# Patient Record
Sex: Female | Born: 1935 | Race: White | Hispanic: No
Health system: Southern US, Community
[De-identification: ages and names within clinical notes are randomized; demographics above are authoritative.]

## PROBLEM LIST (undated history)

## (undated) HISTORY — PX: BREAST CYST ASPIRATION: SHX578

---

## 2004-10-23 ENCOUNTER — Ambulatory Visit: Payer: Self-pay | Admitting: Unknown Physician Specialty

## 2005-04-10 ENCOUNTER — Ambulatory Visit: Payer: Self-pay | Admitting: Internal Medicine

## 2005-04-26 ENCOUNTER — Ambulatory Visit: Payer: Self-pay | Admitting: Surgery

## 2005-06-22 ENCOUNTER — Ambulatory Visit: Payer: Self-pay | Admitting: Cardiology

## 2006-05-28 ENCOUNTER — Ambulatory Visit: Payer: Self-pay | Admitting: Internal Medicine

## 2007-06-12 ENCOUNTER — Ambulatory Visit: Payer: Self-pay | Admitting: Internal Medicine

## 2008-01-06 ENCOUNTER — Encounter: Admission: RE | Admit: 2008-01-06 | Discharge: 2008-01-06 | Payer: Self-pay | Admitting: Orthopedic Surgery

## 2008-01-08 ENCOUNTER — Encounter (INDEPENDENT_AMBULATORY_CARE_PROVIDER_SITE_OTHER): Payer: Self-pay | Admitting: Orthopedic Surgery

## 2008-01-08 ENCOUNTER — Ambulatory Visit (HOSPITAL_BASED_OUTPATIENT_CLINIC_OR_DEPARTMENT_OTHER): Admission: RE | Admit: 2008-01-08 | Discharge: 2008-01-08 | Payer: Self-pay | Admitting: Orthopedic Surgery

## 2008-04-26 ENCOUNTER — Ambulatory Visit: Payer: Self-pay | Admitting: Unknown Physician Specialty

## 2008-05-25 ENCOUNTER — Ambulatory Visit: Payer: Self-pay | Admitting: Internal Medicine

## 2008-06-23 ENCOUNTER — Ambulatory Visit: Payer: Self-pay | Admitting: Internal Medicine

## 2009-05-31 ENCOUNTER — Ambulatory Visit: Payer: Self-pay | Admitting: Internal Medicine

## 2009-06-28 ENCOUNTER — Ambulatory Visit: Payer: Self-pay | Admitting: Internal Medicine

## 2009-08-09 ENCOUNTER — Ambulatory Visit: Payer: Self-pay | Admitting: Internal Medicine

## 2010-07-03 ENCOUNTER — Ambulatory Visit: Payer: Self-pay

## 2010-12-26 NOTE — Op Note (Signed)
Courtney Raymond, Courtney Raymond                ACCOUNT NO.:  [id number]   MEDICAL RECORD NO.:  [id number]          PATIENT TYPE:  AMB   LOCATION:  DSC                          FACILITY:  [facility]   PHYSICIAN:  Katy Fitch. Sypher, M.D. DATE OF BIRTH:  06-23-1930   DATE OF PROCEDURE:  01/08/2008  DATE OF DISCHARGE:                               OPERATIVE REPORT   PREOPERATIVE DIAGNOSIS:  Enlarging mass, volar-radial aspect, right long  finger, middle phalanx.   POSTOPERATIVE DIAGNOSIS:  Probable hemangioma.   OPERATION:  Excisional biopsy of vascular mass, right long finger, volar  aspect.   OPERATING SURGEON:  Katy Fitch. Sypher, MD.   ASSISTANT:  Annye Rusk, PA-C.   ANESTHESIA:  Lidocaine 2% metacarpal head-level block supplemented by IV  sedation.   SUPERVISING ANESTHESIOLOGIST:  Zenon Mayo, MD.   INDICATIONS:  Courtney Raymond is a 77-year woman referred from [place],  [place], for evaluation and management of right long finger mass.   Clinical examination reveals signs of a firm painless mass on the volar-  radial aspect of the right long finger, middle phalangeal segment.  This  is approximately 1 cm in diameter.  This was adjacent to the radial  neurovascular bundle.   Due to failure to respond to nonoperative measures, she is brought to  the operating room at this time for excisional biopsy for diagnosis.   PROCEDURE IN DETAIL:  Talasia Saulter was brought to the operating room and  placed in supine position on the operating table.   Following light sedation, the right arm was prepped with Betadine soap  solution and sterilely draped.  The right long finger was exsanguinated  with a gauze wrap and a 1/2-inch Penrose drain was placed over the  proximal phalangeal segment as a digital tourniquet.   The mass was exposed through a Brunner zigzag incision over the proximal  and middle portion of the middle phalangeal segment of the finger.  Subcutaneous tissues were  carefully divided taking care to identify the  radial neurovascular bundle site.  The mass was purplish, firm, and  adherent to multiple veins.  This was circumferentially dissected, and  the 3 veins were resected with a margin.  A second small hemangioma-type  lesion was identified on the ulnar aspect of the middle phalangeal  segment.  Both lesions were resected en bloc and passed off in formalin  for pathologic evaluation.  Bleeding points were electrocauterized with  bipolar current followed by repair of the skin with mattress suture of 5-  0 nylon x3.   The finger was then dressed with Xeroform sterile gauze and a Coban  dressing.  There were no apparent complications.      Katy Fitch Sypher, M.D.  Electronically Signed     RVS/MEDQ  D:  01/08/2008  T:  01/09/2008  Job:  [id number]

## 2010-12-28 ENCOUNTER — Ambulatory Visit: Payer: Self-pay | Admitting: Internal Medicine

## 2011-05-09 LAB — BASIC METABOLIC PANEL
BUN: 15
CO2: 31
Chloride: 101
Creatinine, Ser: 0.68
Glucose, Bld: 94
Potassium: 4.4

## 2011-07-16 ENCOUNTER — Ambulatory Visit: Payer: Self-pay | Admitting: Internal Medicine

## 2012-07-17 ENCOUNTER — Ambulatory Visit: Payer: Self-pay | Admitting: Unknown Physician Specialty

## 2013-07-22 ENCOUNTER — Ambulatory Visit: Payer: Self-pay | Admitting: Internal Medicine

## 2014-03-11 ENCOUNTER — Ambulatory Visit: Payer: Self-pay | Admitting: Internal Medicine

## 2014-07-27 ENCOUNTER — Ambulatory Visit: Payer: Self-pay | Admitting: Family Medicine

## 2014-08-18 ENCOUNTER — Ambulatory Visit: Payer: Self-pay | Admitting: Family Medicine

## 2015-05-30 ENCOUNTER — Other Ambulatory Visit: Payer: Self-pay | Admitting: Family Medicine

## 2015-05-30 DIAGNOSIS — R911 Solitary pulmonary nodule: Secondary | ICD-10-CM

## 2015-06-02 ENCOUNTER — Ambulatory Visit
Admission: RE | Admit: 2015-06-02 | Discharge: 2015-06-02 | Disposition: A | Discharge status: Home / Self Care | Payer: Medicare Other | Source: Ambulatory Visit | Attending: Family Medicine | Admitting: Family Medicine

## 2015-06-02 DIAGNOSIS — D71 Functional disorders of polymorphonuclear neutrophils: Secondary | ICD-10-CM | POA: Insufficient documentation

## 2015-06-02 DIAGNOSIS — J479 Bronchiectasis, uncomplicated: Secondary | ICD-10-CM | POA: Insufficient documentation

## 2015-06-02 DIAGNOSIS — J8410 Pulmonary fibrosis, unspecified: Secondary | ICD-10-CM | POA: Diagnosis not present

## 2015-06-02 DIAGNOSIS — I517 Cardiomegaly: Secondary | ICD-10-CM | POA: Insufficient documentation

## 2015-06-02 DIAGNOSIS — R911 Solitary pulmonary nodule: Secondary | ICD-10-CM | POA: Diagnosis present

## 2015-06-02 MED ORDER — IOHEXOL 300 MG/ML  SOLN
75.0000 mL | Freq: Once | INTRAMUSCULAR | Status: AC | PRN
  Administered 2015-06-02: 65 mL via INTRAVENOUS

## 2015-07-18 ENCOUNTER — Other Ambulatory Visit: Payer: Self-pay | Admitting: Family Medicine

## 2015-07-18 DIAGNOSIS — Z1231 Encounter for screening mammogram for malignant neoplasm of breast: Secondary | ICD-10-CM

## 2015-07-21 ENCOUNTER — Ambulatory Visit: Payer: Medicare Other

## 2015-08-02 ENCOUNTER — Ambulatory Visit: Payer: Medicare Other

## 2015-08-23 ENCOUNTER — Ambulatory Visit: Payer: Medicare Other

## 2015-08-31 ENCOUNTER — Ambulatory Visit: Payer: Medicare Other

## 2015-09-09 ENCOUNTER — Other Ambulatory Visit: Payer: Self-pay | Admitting: Family Medicine

## 2015-09-09 ENCOUNTER — Ambulatory Visit
Admission: RE | Admit: 2015-09-09 | Discharge: 2015-09-09 | Disposition: A | Discharge status: Home / Self Care | Payer: Medicare Other | Source: Ambulatory Visit | Attending: Family Medicine | Admitting: Family Medicine

## 2015-09-09 DIAGNOSIS — Z1231 Encounter for screening mammogram for malignant neoplasm of breast: Secondary | ICD-10-CM

## 2016-01-17 ENCOUNTER — Other Ambulatory Visit: Payer: Self-pay | Admitting: Orthopedic Surgery

## 2016-01-17 DIAGNOSIS — M5030 Other cervical disc degeneration, unspecified cervical region: Secondary | ICD-10-CM

## 2016-01-18 ENCOUNTER — Other Ambulatory Visit: Payer: Self-pay | Admitting: Physician Assistant

## 2016-01-18 DIAGNOSIS — M5030 Other cervical disc degeneration, unspecified cervical region: Secondary | ICD-10-CM

## 2016-02-08 ENCOUNTER — Ambulatory Visit
Admission: RE | Admit: 2016-02-08 | Discharge: 2016-02-08 | Disposition: A | Discharge status: Home / Self Care | Payer: Medicare Other | Source: Ambulatory Visit | Attending: Orthopedic Surgery | Admitting: Orthopedic Surgery

## 2016-02-08 DIAGNOSIS — M5030 Other cervical disc degeneration, unspecified cervical region: Secondary | ICD-10-CM | POA: Diagnosis present

## 2016-02-08 DIAGNOSIS — M47812 Spondylosis without myelopathy or radiculopathy, cervical region: Secondary | ICD-10-CM | POA: Insufficient documentation

## 2016-02-08 DIAGNOSIS — M4802 Spinal stenosis, cervical region: Secondary | ICD-10-CM | POA: Insufficient documentation

## 2016-02-08 DIAGNOSIS — M1288 Other specific arthropathies, not elsewhere classified, other specified site: Secondary | ICD-10-CM | POA: Diagnosis not present

## 2016-10-04 ENCOUNTER — Other Ambulatory Visit: Payer: Self-pay | Admitting: Family Medicine

## 2016-10-04 DIAGNOSIS — Z1231 Encounter for screening mammogram for malignant neoplasm of breast: Secondary | ICD-10-CM

## 2016-11-02 ENCOUNTER — Ambulatory Visit
Admission: RE | Admit: 2016-11-02 | Discharge: 2016-11-02 | Disposition: A | Discharge status: Home / Self Care | Payer: Medicare Other | Source: Ambulatory Visit | Attending: Family Medicine | Admitting: Family Medicine

## 2016-11-02 DIAGNOSIS — Z1231 Encounter for screening mammogram for malignant neoplasm of breast: Secondary | ICD-10-CM | POA: Diagnosis present

## 2017-09-25 ENCOUNTER — Other Ambulatory Visit: Payer: Self-pay | Admitting: Family Medicine

## 2017-09-25 DIAGNOSIS — Z1231 Encounter for screening mammogram for malignant neoplasm of breast: Secondary | ICD-10-CM

## 2017-11-04 ENCOUNTER — Ambulatory Visit
Admission: RE | Admit: 2017-11-04 | Discharge: 2017-11-04 | Disposition: A | Discharge status: Home / Self Care | Payer: Medicare Other | Source: Ambulatory Visit | Attending: Family Medicine | Admitting: Family Medicine

## 2017-11-04 DIAGNOSIS — Z1231 Encounter for screening mammogram for malignant neoplasm of breast: Secondary | ICD-10-CM | POA: Diagnosis present

## 2018-09-25 ENCOUNTER — Other Ambulatory Visit: Payer: Self-pay | Admitting: Family Medicine

## 2018-09-25 DIAGNOSIS — Z1231 Encounter for screening mammogram for malignant neoplasm of breast: Secondary | ICD-10-CM

## 2020-03-03 ENCOUNTER — Other Ambulatory Visit: Payer: Self-pay

## 2020-03-03 ENCOUNTER — Ambulatory Visit (INDEPENDENT_AMBULATORY_CARE_PROVIDER_SITE_OTHER): Payer: Medicare Other | Admitting: Dermatology

## 2020-03-03 DIAGNOSIS — Z1283 Encounter for screening for malignant neoplasm of skin: Secondary | ICD-10-CM

## 2020-03-03 DIAGNOSIS — S81811A Laceration without foreign body, right lower leg, initial encounter: Secondary | ICD-10-CM

## 2020-03-03 DIAGNOSIS — D1800 Hemangioma unspecified site: Secondary | ICD-10-CM

## 2020-03-03 DIAGNOSIS — D692 Other nonthrombocytopenic purpura: Secondary | ICD-10-CM

## 2020-03-03 DIAGNOSIS — I872 Venous insufficiency (chronic) (peripheral): Secondary | ICD-10-CM

## 2020-03-03 DIAGNOSIS — L578 Other skin changes due to chronic exposure to nonionizing radiation: Secondary | ICD-10-CM

## 2020-03-03 DIAGNOSIS — L821 Other seborrheic keratosis: Secondary | ICD-10-CM

## 2020-03-03 DIAGNOSIS — L84 Corns and callosities: Secondary | ICD-10-CM

## 2020-03-03 DIAGNOSIS — L814 Other melanin hyperpigmentation: Secondary | ICD-10-CM

## 2020-03-03 DIAGNOSIS — D229 Melanocytic nevi, unspecified: Secondary | ICD-10-CM

## 2020-03-03 NOTE — Progress Notes (Signed)
   Follow-Up Visit   Subjective  Courtney Raymond is a 86 y.o. female who presents for the following: Annual Exam (No hx of skin CA or irregular nevi - the patient has noticed no new or changing moles, lesions, or spots). The patient presents for Total-Body Skin Exam (TBSE) for skin cancer screening and mole check.  The following portions of the chart were reviewed this encounter and updated as appropriate:  Allergies  Meds  Problems  Med Hx  Surg Hx  Fam Hx     Review of Systems:  No other skin or systemic complaints except as noted in HPI or Assessment and Plan.  Objective  Well appearing patient in no apparent distress; mood and affect are within normal limits.  A full examination was performed including scalp, head, eyes, ears, nose, lips, neck, chest, axillae, abdomen, back, buttocks, bilateral upper extremities, bilateral lower extremities, hands, feet, fingers, toes, fingernails, and toenails. All findings within normal limits unless otherwise noted below.  Objective  B/L lower leg: Hyperpigmentation of the lower legs  Objective  R med 3rd toe: Thickened plaque   Objective  Right Lower Leg - Anterior: Laceration  Assessment & Plan    Stasis dermatitis of both legs B/L lower leg  Callus of foot R med 3rd toe  Recommend silicone spacers to prevent rubbing. Consider seeing podiatrist if bothersome. Recommend Voltaren cream OTC PRN pain.  Laceration of right lower leg, initial encounter Right Lower Leg - Anterior  Occurred in April 2021 -  Discussed with patient that due to age and location laceration will take longer to heal  Skin cancer screening No evidence of infection today.  Lentigines - Scattered tan macules - Discussed due to sun exposure - Benign, observe - Call for any changes  Seborrheic Keratoses - Stuck-on, waxy, tan-brown papules and plaques  - Discussed benign etiology and prognosis. - Observe - Call for any changes  Melanocytic  Nevi - Tan-brown and/or pink-flesh-colored symmetric macules and papules - Benign appearing on exam today - Observation - Call clinic for new or changing moles - Recommend daily use of broad spectrum spf 30+ sunscreen to sun-exposed areas.   Hemangiomas - Red papules - Discussed benign nature - Observe - Call for any changes  Actinic Damage - diffuse scaly erythematous macules with underlying dyspigmentation - Recommend daily broad spectrum sunscreen SPF 30+ to sun-exposed areas, reapply every 2 hours as needed.  - Call for new or changing lesions.  Purpura - Violaceous macules and patches - Benign - Related to age, sun damage and/or use of blood thinners - Observe - Can use OTC arnica containing moisturizer such as Dermend Bruise Formula if desired - Call for worsening or other concerns  Skin cancer screening performed today.  Return in about 1 year (around 03/03/2021) for TBSE.  Luther Redo, CMA, am acting as scribe for Sarina Ser, MD .  Documentation: I have reviewed the above documentation for accuracy and completeness, and I agree with the above.  Sarina Ser, MD

## 2020-03-06 ENCOUNTER — Encounter: Payer: Self-pay | Admitting: Dermatology

## 2020-05-19 ENCOUNTER — Other Ambulatory Visit: Payer: Self-pay | Admitting: Family Medicine

## 2020-05-19 DIAGNOSIS — Z1231 Encounter for screening mammogram for malignant neoplasm of breast: Secondary | ICD-10-CM

## 2020-08-22 ENCOUNTER — Ambulatory Visit
Admission: RE | Admit: 2020-08-22 | Discharge: 2020-08-22 | Disposition: A | Discharge status: Home / Self Care | Payer: Medicare Other | Source: Ambulatory Visit | Attending: Family Medicine | Admitting: Family Medicine

## 2020-08-22 ENCOUNTER — Other Ambulatory Visit: Payer: Self-pay

## 2020-08-22 DIAGNOSIS — Z1231 Encounter for screening mammogram for malignant neoplasm of breast: Secondary | ICD-10-CM | POA: Insufficient documentation

## 2020-08-30 ENCOUNTER — Other Ambulatory Visit: Payer: Self-pay | Admitting: Family Medicine

## 2020-08-30 DIAGNOSIS — R928 Other abnormal and inconclusive findings on diagnostic imaging of breast: Secondary | ICD-10-CM

## 2020-08-30 DIAGNOSIS — N6320 Unspecified lump in the left breast, unspecified quadrant: Secondary | ICD-10-CM

## 2020-09-06 ENCOUNTER — Ambulatory Visit
Admission: RE | Admit: 2020-09-06 | Discharge: 2020-09-06 | Disposition: A | Discharge status: Home / Self Care | Payer: Medicare Other | Source: Ambulatory Visit | Attending: Family Medicine | Admitting: Family Medicine

## 2020-09-06 ENCOUNTER — Other Ambulatory Visit: Payer: Self-pay

## 2020-09-06 DIAGNOSIS — R928 Other abnormal and inconclusive findings on diagnostic imaging of breast: Secondary | ICD-10-CM | POA: Insufficient documentation

## 2020-09-06 DIAGNOSIS — N6320 Unspecified lump in the left breast, unspecified quadrant: Secondary | ICD-10-CM

## 2021-03-15 ENCOUNTER — Ambulatory Visit (INDEPENDENT_AMBULATORY_CARE_PROVIDER_SITE_OTHER): Payer: Medicare Other | Admitting: Dermatology

## 2021-03-15 ENCOUNTER — Encounter: Payer: Self-pay | Admitting: Dermatology

## 2021-03-15 ENCOUNTER — Other Ambulatory Visit: Payer: Self-pay

## 2021-03-15 DIAGNOSIS — D1800 Hemangioma unspecified site: Secondary | ICD-10-CM

## 2021-03-15 DIAGNOSIS — L817 Pigmented purpuric dermatosis: Secondary | ICD-10-CM

## 2021-03-15 DIAGNOSIS — R202 Paresthesia of skin: Secondary | ICD-10-CM

## 2021-03-15 DIAGNOSIS — D229 Melanocytic nevi, unspecified: Secondary | ICD-10-CM

## 2021-03-15 DIAGNOSIS — Z1283 Encounter for screening for malignant neoplasm of skin: Secondary | ICD-10-CM | POA: Diagnosis not present

## 2021-03-15 DIAGNOSIS — L578 Other skin changes due to chronic exposure to nonionizing radiation: Secondary | ICD-10-CM

## 2021-03-15 DIAGNOSIS — L814 Other melanin hyperpigmentation: Secondary | ICD-10-CM

## 2021-03-15 DIAGNOSIS — L821 Other seborrheic keratosis: Secondary | ICD-10-CM

## 2021-03-15 NOTE — Patient Instructions (Addendum)
Recommend Sarna lotion for itch of right upper back.   If you have any questions or concerns for your doctor, please call our main line at [phone] and press option 4 to reach your doctor's medical assistant. If no one answers, please leave a voicemail as directed and we will return your call as soon as possible. Messages left after 4 pm will be answered the following business day.   You may also send [place] a message via [place]. We typically respond to MyChart messages within 1-2 business days.  For prescription refills, please ask your pharmacy to contact our office. Our fax number is [phone].  If you have an urgent issue when the clinic is closed that cannot wait until the next business day, you can page your doctor at the number below.    Please note that while we do our best to be available for urgent issues outside of office hours, we are not available 24/7.   If you have an urgent issue and are unable to reach [place], you may choose to seek medical care at your doctor's office, retail clinic, urgent care center, or emergency room.  If you have a medical emergency, please immediately call 911 or go to the emergency department.  Pager Numbers  - Dr. Nehemiah Massed: [phone]  - Dr. Laurence Ferrari: [phone]  - Dr. Nicole Kindred: [phone]  In the event of inclement weather, please call our main line at [phone] for an update on the status of any delays or closures.  Dermatology Medication Tips: Please keep the boxes that topical medications come in in order to help keep track of the instructions about where and how to use these. Pharmacies typically print the medication instructions only on the boxes and not directly on the medication tubes.   If your medication is too expensive, please contact our office at [phone] option 4 or send [place] a message through [place].   We are unable to tell what your co-pay for medications will be in advance as this is different depending on your  insurance coverage. However, we may be able to find a substitute medication at lower cost or fill out paperwork to get insurance to cover a needed medication.   If a prior authorization is required to get your medication covered by your insurance company, please allow [place] 1-2 business days to complete this process.  Drug prices often vary depending on where the prescription is filled and some pharmacies may offer cheaper prices.  The website www.goodrx.com contains coupons for medications through different pharmacies. The prices here do not account for what the cost may be with help from insurance (it may be cheaper with your insurance), but the website can give you the price if you did not use any insurance.  - You can print the associated coupon and take it with your prescription to the pharmacy.  - You may also stop by our office during regular business hours and pick up a GoodRx coupon card.  - If you need your prescription sent electronically to a different pharmacy, notify our office through [facility] or by phone at [phone] option 4.

## 2021-03-15 NOTE — Progress Notes (Signed)
   Follow-Up Visit   Subjective  Courtney Raymond is a 87 y.o. female who presents for the following: Annual Exam (No history of skin cancer - TBSE today). The patient presents for Total-Body Skin Exam (TBSE) for skin cancer screening and mole check.  The following portions of the chart were reviewed this encounter and updated as appropriate:   Allergies  Meds  Problems  Med Hx  Surg Hx  Fam Hx     Review of Systems:  No other skin or systemic complaints except as noted in HPI or Assessment and Plan.  Objective  Well appearing patient in no apparent distress; mood and affect are within normal limits.  A full examination was performed including scalp, head, eyes, ears, nose, lips, neck, chest, axillae, abdomen, back, buttocks, bilateral upper extremities, bilateral lower extremities, hands, feet, fingers, toes, fingernails, and toenails. All findings within normal limits unless otherwise noted below.  Right scapula Dyschromia and hypopigmentation  Lower legs Hyperpigmentation    Assessment & Plan   Lentigines - Scattered tan macules - Due to sun exposure - Benign-appering, observe - Recommend daily broad spectrum sunscreen SPF 30+ to sun-exposed areas, reapply every 2 hours as needed. - Call for any changes  Seborrheic Keratoses - Stuck-on, waxy, tan-brown papules and/or plaques  - Benign-appearing - Discussed benign etiology and prognosis. - Observe - Call for any changes  Melanocytic Nevi - Tan-brown and/or pink-flesh-colored symmetric macules and papules - Benign appearing on exam today - Observation - Call clinic for new or changing moles - Recommend daily use of broad spectrum spf 30+ sunscreen to sun-exposed areas.   Hemangiomas - Red papules - Discussed benign nature - Observe - Call for any changes  Actinic Damage - Chronic condition, secondary to cumulative UV/sun exposure - diffuse scaly erythematous macules with underlying dyspigmentation -  Recommend daily broad spectrum sunscreen SPF 30+ to sun-exposed areas, reapply every 2 hours as needed.  - Staying in the shade or wearing long sleeves, sun glasses (UVA+UVB protection) and wide brim hats (4-inch brim around the entire circumference of the hat) are also recommended for sun protection.  - Call for new or changing lesions.  Skin cancer screening performed today.  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 This is usually accompanied by chronic rubbing or scratching. There is no cure, but there are some treatments which may help control the itch.   Over the counter (non-prescription) treatments for notalgia paresthetica include numbing creams like pramoxine or lidocaine which temporarily reduce itch or Capsaicin-containing creams which cause a burning sensation but which sometimes over time will reset the nerves to stop producing itch.   If you choose to use Capsaicin cream, it is recommended to use it 5 times daily for 1 week followed by 3 times daily for 3-6 weeks. You may have to continue using it long-term. For severe cases, there are some prescription cream or pill options which may help.  Schamberg's purpura Lower legs Benign  Skin cancer screening  Return in about 1 year (around 03/15/2022) for TBSE.  I, Ashok Cordia, CMA, am acting as scribe for Sarina Ser, MD .  Documentation: I have reviewed the above documentation for accuracy and completeness, and I agree with the above.  Sarina Ser, MD

## 2021-07-31 ENCOUNTER — Other Ambulatory Visit: Payer: Self-pay | Admitting: Family Medicine

## 2021-07-31 DIAGNOSIS — Z1231 Encounter for screening mammogram for malignant neoplasm of breast: Secondary | ICD-10-CM

## 2021-08-25 ENCOUNTER — Ambulatory Visit
Admission: RE | Admit: 2021-08-25 | Discharge: 2021-08-25 | Disposition: A | Discharge status: Home / Self Care | Payer: Medicare Other | Source: Ambulatory Visit | Attending: Family Medicine | Admitting: Family Medicine

## 2021-08-25 ENCOUNTER — Other Ambulatory Visit: Payer: Self-pay

## 2021-08-25 DIAGNOSIS — Z1231 Encounter for screening mammogram for malignant neoplasm of breast: Secondary | ICD-10-CM | POA: Diagnosis present

## 2022-03-15 ENCOUNTER — Ambulatory Visit (INDEPENDENT_AMBULATORY_CARE_PROVIDER_SITE_OTHER): Payer: Medicare Other | Admitting: Dermatology

## 2022-03-15 DIAGNOSIS — D692 Other nonthrombocytopenic purpura: Secondary | ICD-10-CM

## 2022-03-15 DIAGNOSIS — R202 Paresthesia of skin: Secondary | ICD-10-CM | POA: Diagnosis not present

## 2022-03-15 DIAGNOSIS — L814 Other melanin hyperpigmentation: Secondary | ICD-10-CM

## 2022-03-15 DIAGNOSIS — D1800 Hemangioma unspecified site: Secondary | ICD-10-CM

## 2022-03-15 DIAGNOSIS — D229 Melanocytic nevi, unspecified: Secondary | ICD-10-CM

## 2022-03-15 DIAGNOSIS — I872 Venous insufficiency (chronic) (peripheral): Secondary | ICD-10-CM

## 2022-03-15 DIAGNOSIS — L578 Other skin changes due to chronic exposure to nonionizing radiation: Secondary | ICD-10-CM

## 2022-03-15 DIAGNOSIS — Z1283 Encounter for screening for malignant neoplasm of skin: Secondary | ICD-10-CM | POA: Diagnosis not present

## 2022-03-15 DIAGNOSIS — L821 Other seborrheic keratosis: Secondary | ICD-10-CM

## 2022-03-15 NOTE — Progress Notes (Signed)
Follow-Up Visit   Subjective  Courtney Raymond is a 88 y.o. female who presents for the following The patient presents for Total-Body Skin Exam (TBSE) for skin cancer screening and mole check.  The patient has spots, moles and lesions to be evaluated, some may be new or changing and the patient has concerns that these could be cancer. She has some itching on her back and has a dark area there.  The following portions of the chart were reviewed this encounter and updated as appropriate:   Allergies  Meds  Problems  Med Hx  Surg Hx  Fam Hx     Review of Systems:  No other skin or systemic complaints except as noted in HPI or Assessment and Plan.  Objective  Well appearing patient in no apparent distress; mood and affect are within normal limits.  A full examination was performed including scalp, head, eyes, ears, nose, lips, neck, chest, axillae, abdomen, back, buttocks, bilateral upper extremities, bilateral lower extremities, hands, feet, fingers, toes, fingernails, and toenails. All findings within normal limits unless otherwise noted below.  Back Dyschromia of the R scapula area.  B/L leg Erythematous, scaly patches involving the ankle and distal lower leg with associated lower leg edema.    Assessment & Plan  Notalgia paresthetica Back Notalgia paresthetica is a chronic condition affecting the skin of the back in which a pinched nerve along the spine causes itching or changes in sensation in an area of skin. This is usually accompanied by chronic rubbing or scratching often leaving the area of skin discolored and thickened. There is no cure, but there are some treatments which may help control the itch. Over the counter (non-prescription) treatments for notalgia paresthetica include numbing creams like pramoxine or lidocaine which temporarily reduce itch or Capsaicin-containing creams which cause a burning sensation but which sometimes over time will reset the nerves to stop  producing itch.  If you choose to use Capsaicin cream, it is recommended to use it 5 times daily for 1 week followed by 3 times daily for 3-6 weeks. You may have to continue using it long-term. For severe cases, there are some prescription cream or pill options which may help. Other treatment options include: - Transcutaneous Electrical Nerve Stimulation (TENS) - Gabapentin 300-900 mg daily po - Amitriptyline orally - Paravertebral local anesthetic block - intralesional Botulinum toxin A   Stasis dermatitis of both legs B/L leg With schamberg's purpura - Benign, observe.   Reassured.  Lentigines - Scattered tan macules - Due to sun exposure - Benign-appearing, observe - Recommend daily broad spectrum sunscreen SPF 30+ to sun-exposed areas, reapply every 2 hours as needed. - Call for any changes  Seborrheic Keratoses - Stuck-on, waxy, tan-brown papules and/or plaques  - Benign-appearing - Discussed benign etiology and prognosis. - Observe - Call for any changes  Melanocytic Nevi - Tan-brown and/or pink-flesh-colored symmetric macules and papules - Benign appearing on exam today - Observation - Call clinic for new or changing moles - Recommend daily use of broad spectrum spf 30+ sunscreen to sun-exposed areas.   Hemangiomas - Red papules - Discussed benign nature - Observe - Call for any changes  Actinic Damage - Chronic condition, secondary to cumulative UV/sun exposure - diffuse scaly erythematous macules with underlying dyspigmentation - Recommend daily broad spectrum sunscreen SPF 30+ to sun-exposed areas, reapply every 2 hours as needed.  - Staying in the shade or wearing long sleeves, sun glasses (UVA+UVB protection) and wide brim hats (4-inch brim  around the entire circumference of the hat) are also recommended for sun protection.  - Call for new or changing lesions.  Purpura - Chronic; persistent and recurrent.  Treatable, but not curable. - Violaceous macules  and patches - Benign - Related to trauma, age, sun damage and/or use of blood thinners, chronic use of topical and/or oral steroids - Observe - Can use OTC arnica containing moisturizer such as Dermend Bruise Formula if desired - Call for worsening or other concerns  Skin cancer screening performed today.  Return if symptoms worsen or fail to improve.  Luther Redo, CMA, am acting as scribe for Sarina Ser, MD . Documentation: I have reviewed the above documentation for accuracy and completeness, and I agree with the above.  Sarina Ser, MD

## 2022-03-15 NOTE — Patient Instructions (Addendum)
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 This is usually accompanied by chronic rubbing or scratching often leaving the area of skin discolored and thickened. There is no cure, but there are some treatments which may help control the itch.   Over the counter (non-prescription) treatments for notalgia paresthetica include numbing creams like pramoxine or lidocaine which temporarily reduce itch or Capsaicin-containing creams which cause a burning sensation but which sometimes over time will reset the nerves to stop producing itch.  If you choose to use Capsaicin cream, it is recommended to use it 5 times daily for 1 week followed by 3 times daily for 3-6 weeks. You may have to continue using it long-term. For severe cases, there are some prescription cream or pill options which may help. Other treatment options include: - Transcutaneous Electrical Nerve Stimulation (TENS) - Gabapentin 300-900 mg daily po - Amitriptyline orally - Paravertebral local anesthetic block - intralesional Botulinum toxin A   Due to recent changes in healthcare laws, you may see results of your pathology and/or laboratory studies on MyChart before the doctors have had a chance to review them. We understand that in some cases there may be results that are confusing or concerning to you. Please understand that not all results are received at the same time and often the doctors may need to interpret multiple results in order to provide you with the best plan of care or course of treatment. Therefore, we ask that you please give us 2 business days to thoroughly review all your results before contacting the office for clarification. Should we see a critical lab result, you will be contacted sooner.   If You Need Anything After Your Visit  If you have any questions or concerns for your doctor, please  call our main line at [phone] and press option 4 to reach your doctor's medical assistant. If no one answers, please leave a voicemail as directed and we will return your call as soon as possible. Messages left after 4 pm will be answered the following business day.   You may also send us a message via MyChart. We typically respond to MyChart messages within 1-2 business days.  For prescription refills, please ask your pharmacy to contact our office. Our fax number is [phone].  If you have an urgent issue when the clinic is closed that cannot wait until the next business day, you can page your doctor at the number below.    Please note that while we do our best to be available for urgent issues outside of office hours, we are not available 24/7.   If you have an urgent issue and are unable to reach us, you may choose to seek medical care at your doctor's office, retail clinic, urgent care center, or emergency room.  If you have a medical emergency, please immediately call 911 or go to the emergency department.  Pager Numbers  - Dr. Kowalski: [phone]  - Dr. Moye: [phone]  - Dr. Stewart: [phone]  In the event of inclement weather, please call our main line at [phone] for an update on the status of any delays or closures.  Dermatology Medication Tips: Please keep the boxes that topical medications come in in order to help keep track of the instructions about where and how to use these. Pharmacies typically print the medication instructions only on the boxes   and not directly on the medication tubes.   If your medication is too expensive, please contact our office at [phone] option 4 or send us a message through MyChart.   We are unable to tell what your co-pay for medications will be in advance as this is different depending on your insurance coverage. However, we may be able to find a substitute medication at lower cost or fill out paperwork to get  insurance to cover a needed medication.   If a prior authorization is required to get your medication covered by your insurance company, please allow us 1-2 business days to complete this process.  Drug prices often vary depending on where the prescription is filled and some pharmacies may offer cheaper prices.  The website www.goodrx.com contains coupons for medications through different pharmacies. The prices here do not account for what the cost may be with help from insurance (it may be cheaper with your insurance), but the website can give you the price if you did not use any insurance.  - You can print the associated coupon and take it with your prescription to the pharmacy.  - You may also stop by our office during regular business hours and pick up a GoodRx coupon card.  - If you need your prescription sent electronically to a different pharmacy, notify our office through [facility] MyChart or by phone at [phone] option 4.     Si Usted Necesita Algo Despus de Su Visita  Tambin puede enviarnos un mensaje a travs de MyChart. Por lo general respondemos a los mensajes de MyChart en el transcurso de 1 a 2 das hbiles.  Para renovar recetas, por favor pida a su farmacia que se ponga en contacto con nuestra oficina. Nuestro nmero de fax es el [phone].  Si tiene un asunto urgente cuando la clnica est cerrada y que no puede esperar hasta el siguiente da hbil, puede llamar/localizar a su doctor(a) al nmero que aparece a continuacin.   Por favor, tenga en cuenta que aunque hacemos todo lo posible para estar disponibles para asuntos urgentes fuera del horario de oficina, no estamos disponibles las 24 horas del da, los 7 das de la semana.   Si tiene un problema urgente y no puede comunicarse con nosotros, puede optar por buscar atencin mdica  en el consultorio de su doctor(a), en una clnica privada, en un centro de atencin urgente o en una sala de emergencias.  Si  tiene una emergencia mdica, por favor llame inmediatamente al 911 o vaya a la sala de emergencias.  Nmeros de bper  - Dr. Kowalski: [phone]  - Dra. Moye: [phone]  - Dra. Stewart: [phone]  En caso de inclemencias del tiempo, por favor llame a nuestra lnea principal al [phone] para una actualizacin sobre el estado de cualquier retraso o cierre.  Consejos para la medicacin en dermatologa: Por favor, guarde las cajas en las que vienen los medicamentos de uso tpico para ayudarle a seguir las instrucciones sobre dnde y cmo usarlos. Las farmacias generalmente imprimen las instrucciones del medicamento slo en las cajas y no directamente en los tubos del medicamento.   Si su medicamento es muy caro, por favor, pngase en contacto con nuestra oficina llamando al [phone] y presione la opcin 4 o envenos un mensaje a travs de MyChart.   No podemos decirle cul ser su copago por los medicamentos por adelantado ya que esto es diferente dependiendo de la cobertura de su seguro. Sin embargo, es posible que podamos encontrar   un medicamento sustituto a menor costo o llenar un formulario para que el seguro cubra el medicamento que se considera necesario.   Si se requiere una autorizacin previa para que su compaa de seguros cubra su medicamento, por favor permtanos de 1 a 2 das hbiles para completar este proceso.  Los precios de los medicamentos varan con frecuencia dependiendo del lugar de dnde se surte la receta y alguna farmacias pueden ofrecer precios ms baratos.  El sitio web www.goodrx.com tiene cupones para medicamentos de diferentes farmacias. Los precios aqu no tienen en cuenta lo que podra costar con la ayuda del seguro (puede ser ms barato con su seguro), pero el sitio web puede darle el precio si no utiliz ningn seguro.  - Puede imprimir el cupn correspondiente y llevarlo con su receta a la farmacia.  - Tambin puede pasar por nuestra oficina  durante el horario de atencin regular y recoger una tarjeta de cupones de GoodRx.  - Si necesita que su receta se enve electrnicamente a una farmacia diferente, informe a nuestra oficina a travs de MyChart de [facility] o por telfono llamando al [phone] y presione la opcin 4.  

## 2022-03-17 ENCOUNTER — Encounter: Payer: Self-pay | Admitting: Dermatology

## 2022-10-13 IMAGING — MG MM DIGITAL SCREENING BILAT W/ TOMO AND CAD
6 of 10 series · 6 of 30 positions shown · non-contrast
Comparison: Previous exam(s).

CLINICAL DATA: Screening.

EXAM:
DIGITAL SCREENING BILATERAL MAMMOGRAM WITH TOMOSYNTHESIS AND CAD
TECHNIQUE: Bilateral screening digital craniocaudal and mediolateral oblique
mammograms were obtained. Bilateral screening digital breast
tomosynthesis was performed. The images were evaluated with
computer-aided detection.

[R MLO synth-2D (1 of 2)]
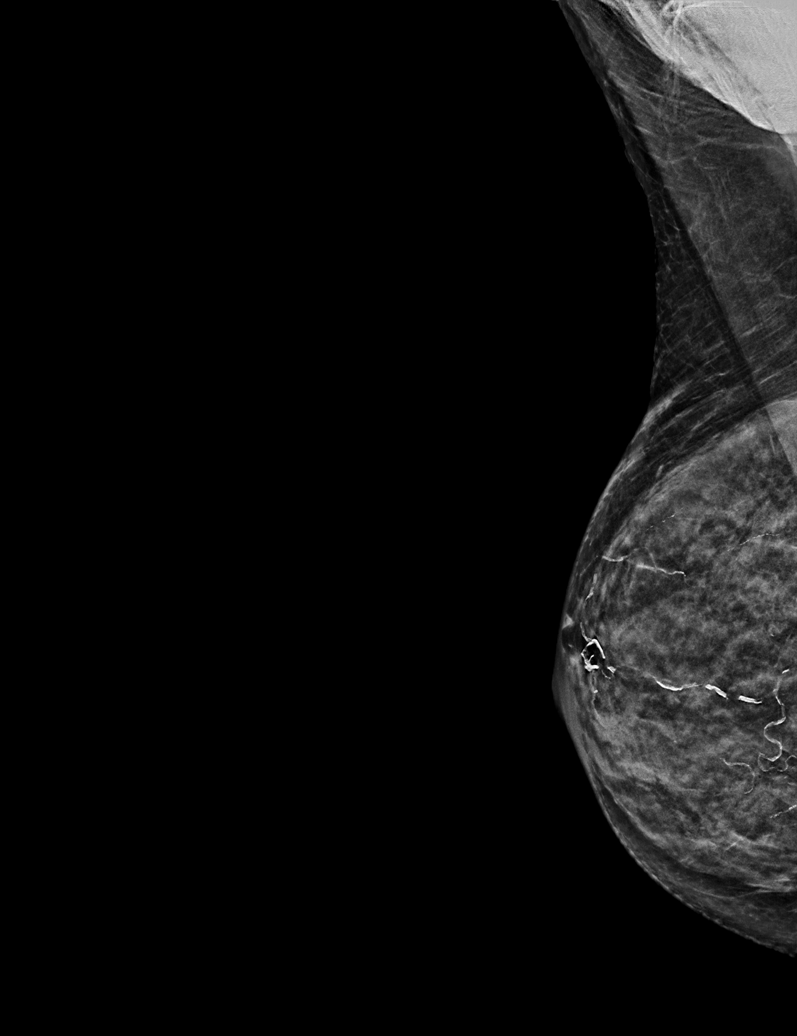

[R MLO synth-2D (2 of 2)]
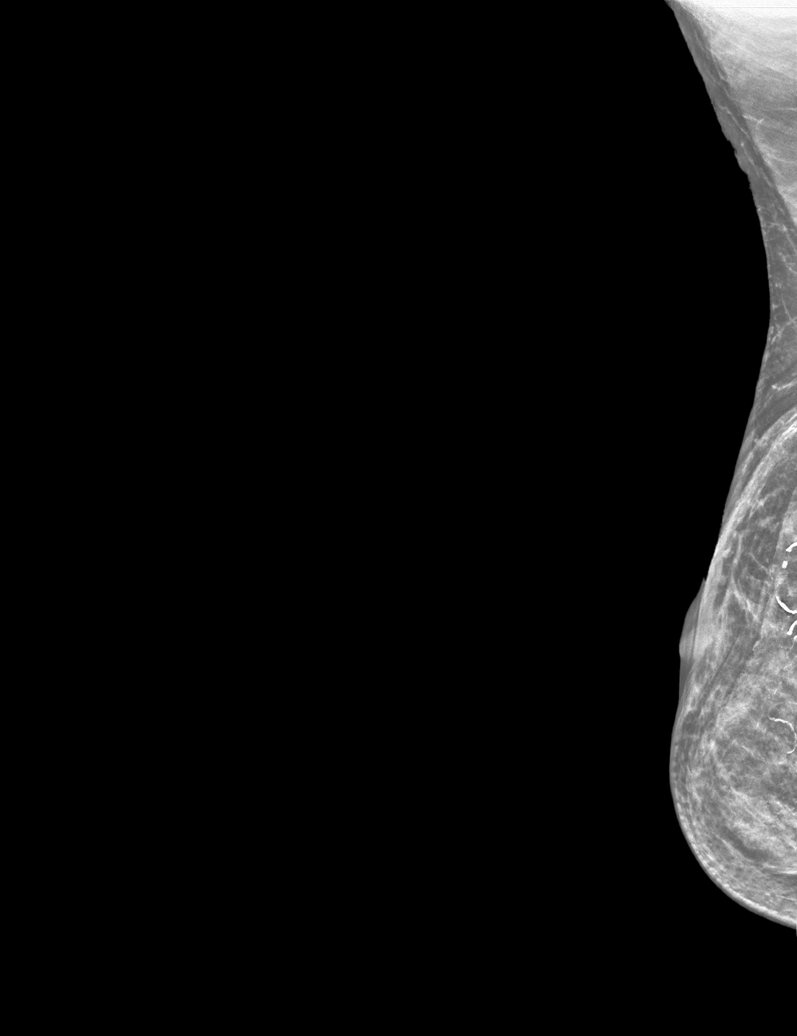

[L MLO synth-2D]
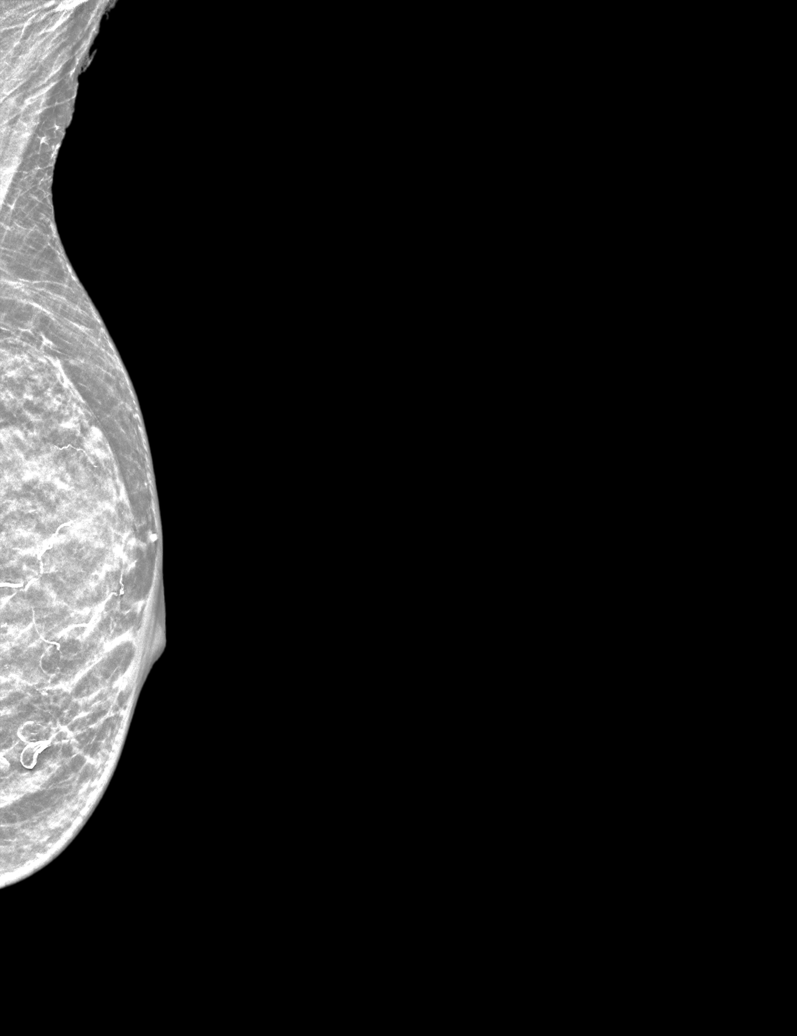

[R CC synth-2D]
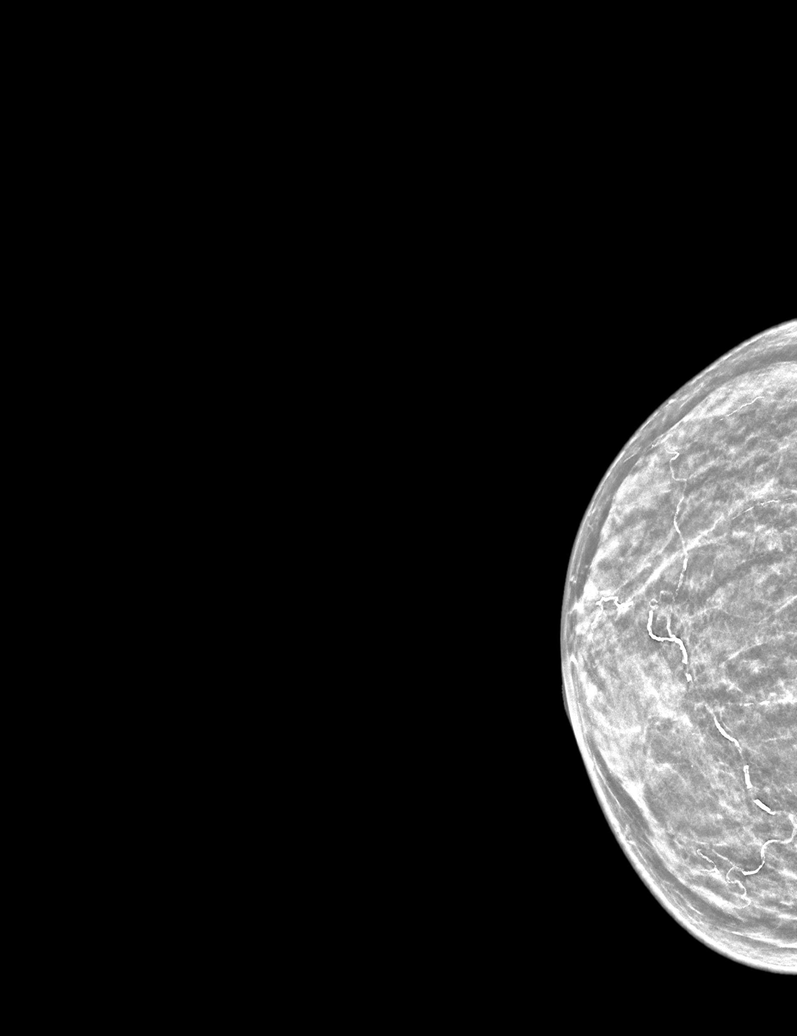

[L CC synth-2D]
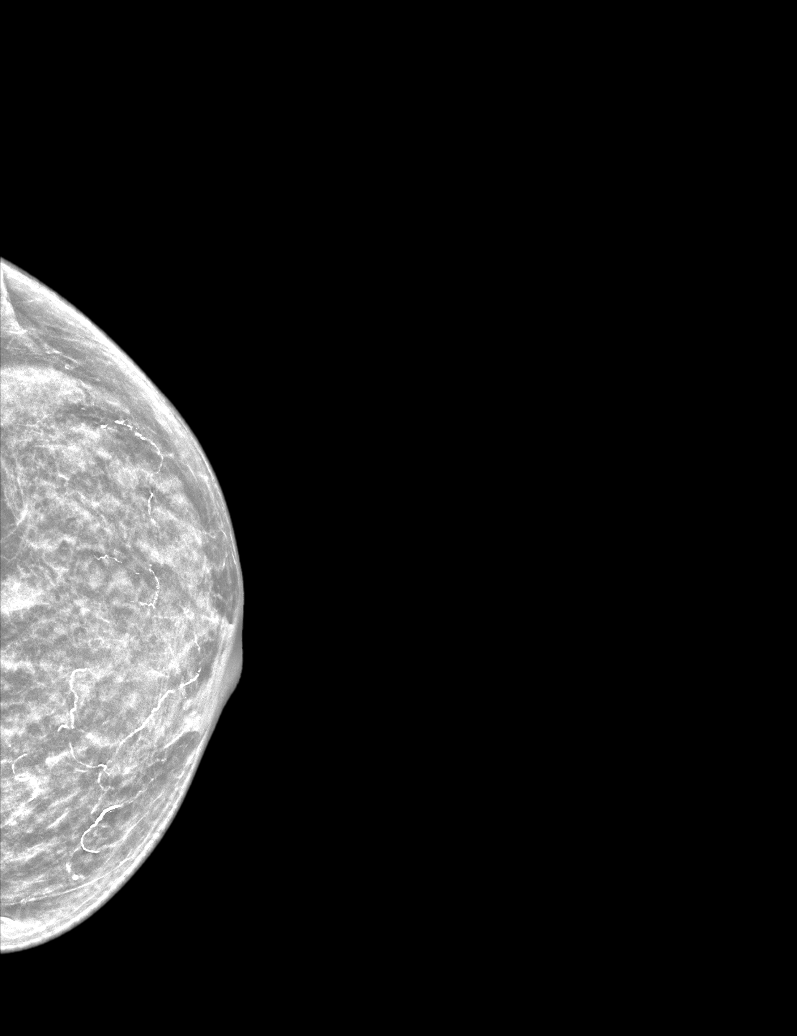

[R MLO tomo · tomo slice 20/39.0]
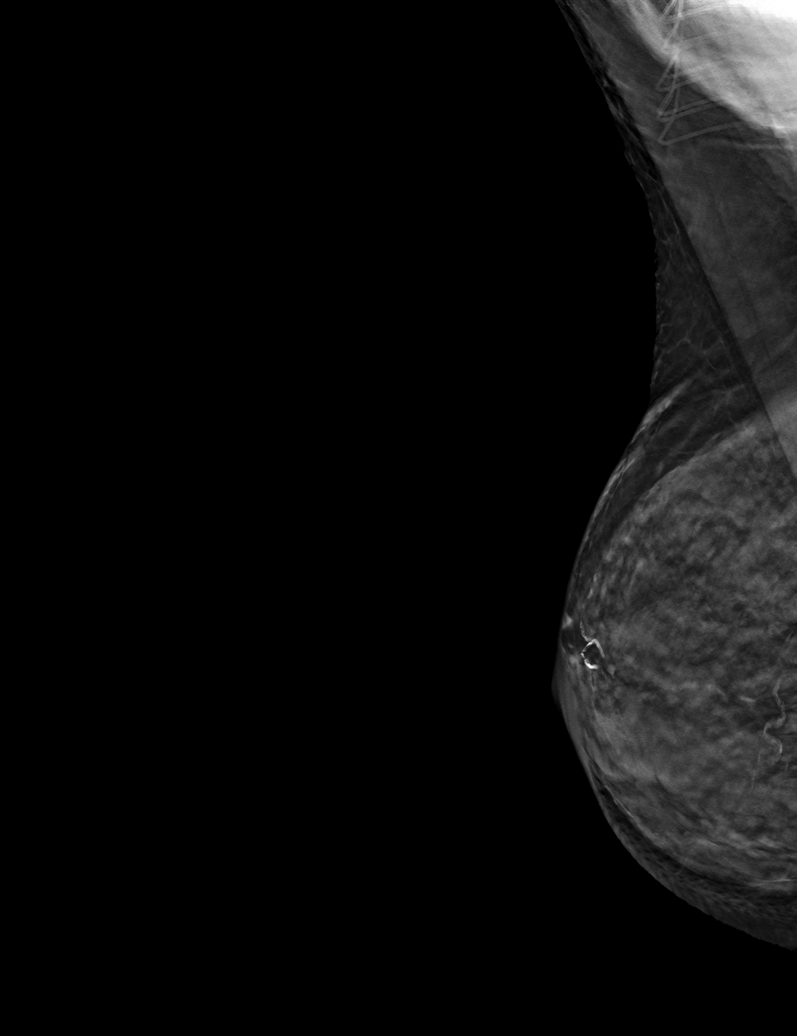

[6 of 30 positions shown; findings below may reference images not displayed]

ACR Breast Density Category d: The breast tissue is extremely dense,
which lowers the sensitivity of mammography
FINDINGS: There are no findings suspicious for malignancy.
IMPRESSION: No mammographic evidence of malignancy. A result letter of this
screening mammogram will be mailed directly to the patient.

RECOMMENDATION:
Screening mammogram in one year. (Code:[id number])

BI-RADS CATEGORY  1: Negative.
